# Patient Record
Sex: Female | Born: 1995 | Race: Black or African American | Hispanic: No | Marital: Single | Smoking: Never smoker
Health system: Southern US, Community
[De-identification: ages and names within clinical notes are randomized; demographics above are authoritative.]

## PROBLEM LIST (undated history)

## (undated) DIAGNOSIS — J45909 Unspecified asthma, uncomplicated: Secondary | ICD-10-CM

## (undated) HISTORY — DX: Unspecified asthma, uncomplicated: J45.909

---

## 2019-08-22 DIAGNOSIS — Z3201 Encounter for pregnancy test, result positive: Secondary | ICD-10-CM | POA: Diagnosis not present

## 2019-08-22 DIAGNOSIS — O200 Threatened abortion: Secondary | ICD-10-CM | POA: Diagnosis not present

## 2019-08-23 DIAGNOSIS — Z3201 Encounter for pregnancy test, result positive: Secondary | ICD-10-CM | POA: Diagnosis not present

## 2019-08-23 DIAGNOSIS — O200 Threatened abortion: Secondary | ICD-10-CM | POA: Diagnosis not present

## 2019-12-05 ENCOUNTER — Ambulatory Visit (INDEPENDENT_AMBULATORY_CARE_PROVIDER_SITE_OTHER): Payer: Medicaid Other | Admitting: Obstetrics & Gynecology

## 2019-12-05 ENCOUNTER — Encounter: Payer: Self-pay | Admitting: Obstetrics & Gynecology

## 2019-12-05 ENCOUNTER — Other Ambulatory Visit (HOSPITAL_COMMUNITY)
Admission: RE | Admit: 2019-12-05 | Discharge: 2019-12-05 | Disposition: A | Discharge status: Home / Self Care | Payer: Medicaid Other | Source: Ambulatory Visit | Attending: Obstetrics & Gynecology | Admitting: Obstetrics & Gynecology

## 2019-12-05 ENCOUNTER — Encounter: Payer: Self-pay | Admitting: Obstetrics

## 2019-12-05 ENCOUNTER — Other Ambulatory Visit: Payer: Self-pay

## 2019-12-05 VITALS — BP 101/64 | HR 96 | Ht 68.0 in | Wt 194.5 lb

## 2019-12-05 DIAGNOSIS — Z3481 Encounter for supervision of other normal pregnancy, first trimester: Secondary | ICD-10-CM

## 2019-12-05 DIAGNOSIS — Z3402 Encounter for supervision of normal first pregnancy, second trimester: Secondary | ICD-10-CM

## 2019-12-05 DIAGNOSIS — Z315 Encounter for genetic counseling: Secondary | ICD-10-CM | POA: Diagnosis not present

## 2019-12-05 DIAGNOSIS — J45909 Unspecified asthma, uncomplicated: Secondary | ICD-10-CM

## 2019-12-05 DIAGNOSIS — Z3482 Encounter for supervision of other normal pregnancy, second trimester: Secondary | ICD-10-CM | POA: Diagnosis not present

## 2019-12-05 DIAGNOSIS — O99519 Diseases of the respiratory system complicating pregnancy, unspecified trimester: Secondary | ICD-10-CM

## 2019-12-05 NOTE — Progress Notes (Signed)
  Subjective:    Rita Logan is a G1P0 [redacted]w[redacted]d being seen today for her first obstetrical visit.  Her obstetrical history is significant for asthma. Patient does intend to breast feed. Pregnancy history fully reviewed.  Patient reports no complaints.  Vitals:   12/05/19 1416 12/05/19 1417  BP: 101/64   Pulse: 96   Weight: 194 lb 8 oz (88.2 kg)   Height:  5\' 8"  (1.727 m)    HISTORY: OB History  Gravida Para Term Preterm AB Living  1            SAB TAB Ectopic Multiple Live Births               # Outcome Date GA Lbr Len/2nd Weight Sex Delivery Anes PTL Lv  1 Current            Past Medical History:  Diagnosis Date  . Asthma    History reviewed. No pertinent surgical history. History reviewed. No pertinent family history.   Exam    Uterus:     Pelvic Exam:    Perineum: No Hemorrhoids   Vulva: normal   Vagina:  thin grey discharge   pH:     Cervix: no lesions   Adnexa: not evaluated   Bony Pelvis: average  System: Breast:  normal appearance, no masses or tenderness   Skin: normal coloration and turgor, no rashes    Neurologic: oriented, normal mood   Extremities: normal strength, tone, and muscle mass   HEENT PERRLA, extra ocular movement intact, thyroid without masses and trachea midline   Mouth/Teeth mucous membranes moist, pharynx normal without lesions and dental hygiene good   Neck supple and no masses   Cardiovascular: regular rate and rhythm   Respiratory:  appears well, vitals normal, no respiratory distress, acyanotic, normal RR, neck free of mass or lymphadenopathy, chest clear, no wheezing, crepitations, rhonchi, normal symmetric air entry   Abdomen: 21 week size fundus   Urinary: urethral meatus normal      Assessment:    Pregnancy: G1P0 Patient Active Problem List   Diagnosis Date Noted  . Encounter for supervision of normal first pregnancy in second trimester 12/05/2019        Plan:     Initial labs drawn. Prenatal  vitamins. Problem list reviewed and updated. Panorama and Horizon ordered.  Ultrasound discussed; fetal survey: ordered.  Follow up in 4 weeks. 50% of 30 min visit spent on counseling and coordination of care.  EDD based on conception date   12/07/2019 12/05/2019

## 2019-12-05 NOTE — Progress Notes (Signed)
Pt is here for new OB appt. Unsure of LMP, working off of LMP as 07/08/2019. Last pap was last in December 2020 per patient, normal.

## 2019-12-05 NOTE — Patient Instructions (Signed)

## 2019-12-06 LAB — CERVICOVAGINAL ANCILLARY ONLY
Bacterial Vaginitis (gardnerella): NEGATIVE
Candida Glabrata: NEGATIVE
Candida Vaginitis: NEGATIVE
Chlamydia: NEGATIVE
Comment: NEGATIVE
Comment: NEGATIVE
Comment: NEGATIVE
Comment: NEGATIVE
Comment: NEGATIVE
Comment: NORMAL
Neisseria Gonorrhea: NEGATIVE
Trichomonas: NEGATIVE

## 2019-12-06 LAB — OBSTETRIC PANEL, INCLUDING HIV
Antibody Screen: NEGATIVE
Basophils Absolute: 0 10*3/uL (ref 0.0–0.2)
Basos: 0 %
EOS (ABSOLUTE): 0.1 10*3/uL (ref 0.0–0.4)
Eos: 1 %
HIV Screen 4th Generation wRfx: NONREACTIVE
Hematocrit: 34.3 % (ref 34.0–46.6)
Hemoglobin: 11.3 g/dL (ref 11.1–15.9)
Hepatitis B Surface Ag: NEGATIVE
Immature Grans (Abs): 0 10*3/uL (ref 0.0–0.1)
Immature Granulocytes: 1 %
Lymphocytes Absolute: 2.6 10*3/uL (ref 0.7–3.1)
Lymphs: 30 %
MCH: 28 pg (ref 26.6–33.0)
MCHC: 32.9 g/dL (ref 31.5–35.7)
MCV: 85 fL (ref 79–97)
Monocytes Absolute: 0.6 10*3/uL (ref 0.1–0.9)
Monocytes: 7 %
Neutrophils Absolute: 5.2 10*3/uL (ref 1.4–7.0)
Neutrophils: 61 %
Platelets: 202 10*3/uL (ref 150–450)
RBC: 4.03 x10E6/uL (ref 3.77–5.28)
RDW: 13.9 % (ref 11.7–15.4)
RPR Ser Ql: NONREACTIVE
Rh Factor: POSITIVE
Rubella Antibodies, IGG: 2.98 index (ref >0.99)
WBC: 8.6 10*3/uL (ref 3.4–10.8)

## 2019-12-06 LAB — HEPATITIS C ANTIBODY: Hep C Virus Ab: <0.1 s/co ratio (ref 0.0–0.9)

## 2019-12-08 LAB — URINE CULTURE, OB REFLEX

## 2019-12-08 LAB — CULTURE, OB URINE

## 2019-12-12 ENCOUNTER — Encounter: Payer: Self-pay | Admitting: Obstetrics and Gynecology

## 2019-12-19 ENCOUNTER — Encounter: Payer: Self-pay | Admitting: Obstetrics and Gynecology

## 2019-12-19 DIAGNOSIS — D563 Thalassemia minor: Secondary | ICD-10-CM | POA: Insufficient documentation

## 2019-12-23 DIAGNOSIS — Z20828 Contact with and (suspected) exposure to other viral communicable diseases: Secondary | ICD-10-CM | POA: Diagnosis not present

## 2019-12-28 ENCOUNTER — Ambulatory Visit: Payer: Medicaid Other | Attending: Obstetrics and Gynecology

## 2019-12-28 ENCOUNTER — Other Ambulatory Visit: Payer: Self-pay

## 2019-12-28 ENCOUNTER — Other Ambulatory Visit: Payer: Self-pay | Admitting: *Deleted

## 2019-12-28 DIAGNOSIS — Z3A24 24 weeks gestation of pregnancy: Secondary | ICD-10-CM

## 2019-12-28 DIAGNOSIS — Z363 Encounter for antenatal screening for malformations: Secondary | ICD-10-CM | POA: Diagnosis not present

## 2019-12-28 DIAGNOSIS — Z3687 Encounter for antenatal screening for uncertain dates: Secondary | ICD-10-CM

## 2019-12-28 DIAGNOSIS — Z3402 Encounter for supervision of normal first pregnancy, second trimester: Secondary | ICD-10-CM | POA: Insufficient documentation

## 2019-12-28 DIAGNOSIS — Z362 Encounter for other antenatal screening follow-up: Secondary | ICD-10-CM

## 2020-01-02 ENCOUNTER — Ambulatory Visit (INDEPENDENT_AMBULATORY_CARE_PROVIDER_SITE_OTHER): Payer: Medicaid Other | Admitting: Advanced Practice Midwife

## 2020-01-02 ENCOUNTER — Encounter: Payer: Self-pay | Admitting: Obstetrics

## 2020-01-02 ENCOUNTER — Other Ambulatory Visit: Payer: Self-pay

## 2020-01-02 ENCOUNTER — Other Ambulatory Visit: Payer: Medicaid Other

## 2020-01-02 ENCOUNTER — Encounter: Payer: Self-pay | Admitting: Advanced Practice Midwife

## 2020-01-02 DIAGNOSIS — Z3400 Encounter for supervision of normal first pregnancy, unspecified trimester: Secondary | ICD-10-CM | POA: Diagnosis not present

## 2020-01-02 DIAGNOSIS — Z3402 Encounter for supervision of normal first pregnancy, second trimester: Secondary | ICD-10-CM | POA: Diagnosis not present

## 2020-01-02 DIAGNOSIS — J45909 Unspecified asthma, uncomplicated: Secondary | ICD-10-CM

## 2020-01-02 DIAGNOSIS — Z3A26 26 weeks gestation of pregnancy: Secondary | ICD-10-CM

## 2020-01-02 DIAGNOSIS — O99512 Diseases of the respiratory system complicating pregnancy, second trimester: Secondary | ICD-10-CM

## 2020-01-02 MED ORDER — PRENATAL VITAMIN 27-0.8 MG PO TABS: 1.0000 | ORAL_TABLET | Freq: Every day | ORAL | 12 refills | Status: AC

## 2020-01-02 MED ORDER — BLOOD PRESSURE KIT DEVI: 1.0000 | 0 refills | Status: AC | PRN

## 2020-01-02 MED ORDER — HYDROCORTISONE 1 % EX OINT: 1.0000 "application " | TOPICAL_OINTMENT | Freq: Two times a day (BID) | CUTANEOUS | 0 refills | Status: AC

## 2020-01-02 MED ORDER — BUDESONIDE 90 MCG/ACT IN AEPB: 1.0000 | INHALATION_SPRAY | Freq: Two times a day (BID) | RESPIRATORY_TRACT | 2 refills | Status: AC

## 2020-01-02 NOTE — Addendum Note (Signed)
Addended by: Natale Milch D on: 01/02/2020 11:25 AM   Modules accepted: Orders

## 2020-01-02 NOTE — Progress Notes (Signed)
   PRENATAL VISIT NOTE  Subjective:  Rita Logan is a 24 y.o. G1P0 at [redacted]w[redacted]d being seen today for ongoing prenatal care.  She is currently monitored for the following issues for this low-risk pregnancy and has Encounter for supervision of normal first pregnancy in second trimester and Alpha thalassemia silent carrier on their problem list.  Patient reports no complaints.  Contractions: Not present. Vag. Bleeding: None.  Movement: Present. Denies leaking of fluid.   The following portions of the patient's history were reviewed and updated as appropriate: allergies, current medications, past family history, past medical history, past social history, past surgical history and problem list.   Objective:   Vitals:   01/02/20 1033  BP: 117/71  Pulse: 96  Weight: 198 lb 4.8 oz (89.9 kg)    Fetal Status: Fetal Heart Rate (bpm): 153 Fundal Height: 26 cm Movement: Present     General:  Alert, oriented and cooperative. Patient is in no acute distress.  Skin: Skin is warm and dry. No rash noted.   Cardiovascular: Normal heart rate noted  Respiratory: Normal respiratory effort, no problems with respiration noted  Abdomen: Soft, gravid, appropriate for gestational age.  Pain/Pressure: Absent     Pelvic: Cervical exam deferred        Extremities: Normal range of motion.     Mental Status: Normal mood and affect. Normal behavior. Normal judgment and thought content.   Assessment and Plan:  Pregnancy: G1P0 at [redacted]w[redacted]d 1. Encounter for supervision of normal first pregnancy in second trimester - routine care - GTT and 28 week labs today  - Patient has asthma and only uses rescue inhaler. She is now feeling like she is having worsening asthma symptoms. Her pulmonologist is in [place]. Using rescue inhaler 3-4 times per day. Add pulmicort and will send referral to pulmonology  - Also has worsening eczema on neck, chest. RX sent for hydrocortisone ointment - needs note to be able to schedule  dental visit, note provided    Preterm labor symptoms and general obstetric precautions including but not limited to vaginal bleeding, contractions, leaking of fluid and fetal movement were reviewed in detail with the patient. Please refer to After Visit Summary for other counseling recommendations.   Return in about 2 weeks (around 01/16/2020) for virtual visit .  Future Appointments  Date Time Provider Department Center  01/16/2020  8:55 AM [facility], Wilmer Floor, CNM [facility] None  01/28/2020  8:00 AM [facility] NURSE [facility]  01/28/2020  8:00 AM [facility] US1 [facility]    Thressa Sheller DNP, CNM  01/02/20  11:15 AM

## 2020-01-02 NOTE — Progress Notes (Signed)
Patient reports fetal movement, denies pain. 

## 2020-01-03 LAB — RPR: RPR Ser Ql: NONREACTIVE

## 2020-01-03 LAB — GLUCOSE TOLERANCE, 2 HOURS W/ 1HR
Glucose, 1 hour: 91 mg/dL (ref 65–179)
Glucose, 2 hour: 77 mg/dL (ref 65–152)
Glucose, Fasting: 76 mg/dL (ref 65–91)

## 2020-01-03 LAB — HIV ANTIBODY (ROUTINE TESTING W REFLEX): HIV Screen 4th Generation wRfx: NONREACTIVE

## 2020-01-16 ENCOUNTER — Telehealth (INDEPENDENT_AMBULATORY_CARE_PROVIDER_SITE_OTHER): Payer: Medicaid Other | Admitting: Advanced Practice Midwife

## 2020-01-16 DIAGNOSIS — Z3A28 28 weeks gestation of pregnancy: Secondary | ICD-10-CM

## 2020-01-16 DIAGNOSIS — Z3402 Encounter for supervision of normal first pregnancy, second trimester: Secondary | ICD-10-CM

## 2020-01-16 DIAGNOSIS — D563 Thalassemia minor: Secondary | ICD-10-CM

## 2020-01-16 DIAGNOSIS — O99013 Anemia complicating pregnancy, third trimester: Secondary | ICD-10-CM

## 2020-01-16 DIAGNOSIS — O288 Other abnormal findings on antenatal screening of mother: Secondary | ICD-10-CM

## 2020-01-16 NOTE — Progress Notes (Signed)
    PRENATAL VISIT NOTE  Subjective:  Rita Logan is a 24 y.o. G1P0 at [redacted]w[redacted]d being seen today for ongoing prenatal care.  She is currently monitored for the following issues for this low-risk pregnancy and has Encounter for supervision of normal first pregnancy in second trimester and Alpha thalassemia silent carrier on their problem list.  Patient reports no complaints.  Contractions: Not present. Vag. Bleeding: None.  Movement: Present. Denies leaking of fluid.   The following portions of the patient's history were reviewed and updated as appropriate: allergies, current medications, past family history, past medical history, past social history, past surgical history and problem list.   Objective:  There were no vitals filed for this visit.  Fetal Status:     Movement: Present     General:  Alert, oriented and cooperative. Patient is in no acute distress.  Skin: Skin is warm and dry. No rash noted.   Cardiovascular: Normal heart rate noted  Respiratory: Normal respiratory effort, no problems with respiration noted  Abdomen: Soft, gravid, appropriate for gestational age.  Pain/Pressure: Absent     Pelvic: Cervical exam deferred        Extremities: Normal range of motion.  Edema: None  Mental Status: Normal mood and affect. Normal behavior. Normal judgment and thought content.   Assessment and Plan:  Pregnancy: G1P0 at [redacted]w[redacted]d 1. Alpha thalassemia silent carrier --Discussed testing results, available counseling from [facility] and counseling/partner testing from [place]. Pt to contact Avelina Laine, given phone and website today.  2. Encounter for supervision of normal first pregnancy in second trimester --Pt reports good fetal movement, denies cramping, LOF, or vaginal bleeding --Anticipatory guidance about next visits/weeks of pregnancy given. --Next visit in 4 weeks, virtual  Preterm labor symptoms and general obstetric precautions including but not limited to vaginal bleeding,  contractions, leaking of fluid and fetal movement were reviewed in detail with the patient. Please refer to After Visit Summary for other counseling recommendations.   Return in about 4 weeks (around 02/13/2020).  Future Appointments  Date Time Provider Department Center  01/28/2020  8:00 AM [facility] NURSE [facility]  01/28/2020  8:00 AM [facility] US1 [facility]    Sharen Counter, CNM

## 2020-01-16 NOTE — Progress Notes (Signed)
Virtual ROB   CC:  NONE   Pt not made aware of 2hr GTT Results yet. Not on Mychart.

## 2020-01-28 ENCOUNTER — Ambulatory Visit: Payer: Medicaid Other | Admitting: *Deleted

## 2020-01-28 ENCOUNTER — Other Ambulatory Visit: Payer: Self-pay | Admitting: *Deleted

## 2020-01-28 ENCOUNTER — Other Ambulatory Visit: Payer: Self-pay

## 2020-01-28 ENCOUNTER — Ambulatory Visit: Payer: Medicaid Other | Attending: Obstetrics and Gynecology

## 2020-01-28 DIAGNOSIS — Z3402 Encounter for supervision of normal first pregnancy, second trimester: Secondary | ICD-10-CM

## 2020-01-28 DIAGNOSIS — D563 Thalassemia minor: Secondary | ICD-10-CM | POA: Insufficient documentation

## 2020-01-28 DIAGNOSIS — Z362 Encounter for other antenatal screening follow-up: Secondary | ICD-10-CM | POA: Insufficient documentation

## 2020-01-28 DIAGNOSIS — Z3A28 28 weeks gestation of pregnancy: Secondary | ICD-10-CM | POA: Diagnosis not present

## 2020-01-29 ENCOUNTER — Inpatient Hospital Stay (HOSPITAL_COMMUNITY)
Admission: AD | Admit: 2020-01-29 | Discharge: 2020-01-29 | Disposition: A | Discharge status: Home / Self Care | Payer: Medicaid Other | Attending: Obstetrics and Gynecology | Admitting: Obstetrics and Gynecology

## 2020-01-29 ENCOUNTER — Encounter (HOSPITAL_COMMUNITY): Payer: Self-pay | Admitting: Obstetrics and Gynecology

## 2020-01-29 ENCOUNTER — Other Ambulatory Visit: Payer: Self-pay

## 2020-01-29 DIAGNOSIS — D563 Thalassemia minor: Secondary | ICD-10-CM

## 2020-01-29 DIAGNOSIS — Z3402 Encounter for supervision of normal first pregnancy, second trimester: Secondary | ICD-10-CM

## 2020-01-29 DIAGNOSIS — O26899 Other specified pregnancy related conditions, unspecified trimester: Secondary | ICD-10-CM

## 2020-01-29 DIAGNOSIS — O26893 Other specified pregnancy related conditions, third trimester: Secondary | ICD-10-CM | POA: Diagnosis not present

## 2020-01-29 DIAGNOSIS — Z3A30 30 weeks gestation of pregnancy: Secondary | ICD-10-CM | POA: Diagnosis not present

## 2020-01-29 DIAGNOSIS — J45909 Unspecified asthma, uncomplicated: Secondary | ICD-10-CM | POA: Diagnosis not present

## 2020-01-29 DIAGNOSIS — O99513 Diseases of the respiratory system complicating pregnancy, third trimester: Secondary | ICD-10-CM | POA: Insufficient documentation

## 2020-01-29 DIAGNOSIS — R109 Unspecified abdominal pain: Secondary | ICD-10-CM | POA: Insufficient documentation

## 2020-01-29 LAB — WET PREP, GENITAL
Clue Cells Wet Prep HPF POC: NONE SEEN
Sperm: NONE SEEN
Trich, Wet Prep: NONE SEEN
Yeast Wet Prep HPF POC: NONE SEEN

## 2020-01-29 LAB — URINALYSIS, ROUTINE W REFLEX MICROSCOPIC
Bilirubin Urine: NEGATIVE
Glucose, UA: NEGATIVE mg/dL
Hgb urine dipstick: NEGATIVE
Ketones, ur: NEGATIVE mg/dL
Leukocytes,Ua: NEGATIVE
Nitrite: NEGATIVE
Protein, ur: NEGATIVE mg/dL
Specific Gravity, Urine: 1.023 (ref 1.005–1.030)
pH: 5 (ref 5.0–8.0)

## 2020-01-29 LAB — HIV ANTIBODY (ROUTINE TESTING W REFLEX): HIV Screen 4th Generation wRfx: NONREACTIVE

## 2020-01-29 NOTE — MAU Note (Signed)
Was having cramping, started last night, continued today. Was worse when she was at work.  No bleeding or leaking. Denies hx of PTL. No recent intercourse. Denies any diarrhea or constipation. No urinary symptoms.

## 2020-01-29 NOTE — MAU Provider Note (Addendum)
History     CSN: [id number]  Arrival date and time: 01/29/20 1538   First Provider Initiated Contact with Patient 01/29/20 1936      Chief Complaint  Patient presents with  . Abdominal Pain   [name] is a 24 y.o. year old G47P0 female at 29w1dweeks gestation who presents to MAU reporting abdominal cramping that started last night has continued through today was worse while she was at work.  She denies vaginal bleeding or leaking of fluid. She has no history of preterm labor. She denies recent sexual intercourse. She denies diarrhea, constipation or urinary symptoms. [facility] She has a virtual visit there on 02/13/2020.   OB History    Gravida  1   Para      Term      Preterm      AB      Living        SAB      TAB      Ectopic      Multiple      Live Births              Past Medical History:  Diagnosis Date  . Asthma     History reviewed. No pertinent surgical history.  History reviewed. No pertinent family history.  Social History   Tobacco Use  . Smoking status: Never Smoker  . Smokeless tobacco: Never Used  Substance Use Topics  . Alcohol use: Not Currently  . Drug use: Never    Allergies:  Allergies  Allergen Reactions  . Nickel   . Tomato     Medications Prior to Admission  Medication Sig Dispense Refill Last Dose  . Budesonide 90 MCG/ACT inhaler Inhale 1 puff into the lungs 2 (two) times daily. 1 each 2 01/29/2020 at Unknown time  . Prenatal Vit-Fe Fumarate-FA (PRENATAL VITAMIN) 27-0.8 MG TABS Take 1 tablet by mouth daily. 30 tablet 12 01/29/2020 at Unknown time  . Blood Pressure Monitoring (BLOOD PRESSURE KIT) DEVI 1 kit by Does not apply route as needed. (Patient not taking: Reported on 01/16/2020) 1 each 0   . hydrocortisone 1 % ointment Apply 1 application topically 2 (two) times daily. 30 g 0   . prenatal vitamin w/FE, FA (PRENATAL 1 + 1) 27-1 MG TABS tablet Take 1 tablet by mouth daily at 12 noon.        Review of Systems  Constitutional: Negative.   HENT: Negative.   Eyes: Negative.   Respiratory: Negative.   Cardiovascular: Negative.   Gastrointestinal: Negative.   Endocrine: Negative.   Genitourinary: Positive for pelvic pain (cramping since last night).  Musculoskeletal: Negative.   Skin: Negative.   Allergic/Immunologic: Negative.   Neurological: Negative.   Hematological: Negative.   Psychiatric/Behavioral: Negative.    Physical Exam   Blood pressure 115/68, pulse 91, temperature 98.7 F (37.1 C), temperature source Oral, resp. rate 18, height 5' 8"  (1.727 m), weight 93 kg, last menstrual period 07/08/2019, SpO2 100 %.  Physical Exam  Physical Examination: General appearance - alert, well appearing, and in no distress and oriented to person, place, and time Mental status - alert, oriented to person, place, and time Eyes - pupils equal and reactive, extraocular eye movements intact Ears - bilateral TM's and external ear canals normal Nose - normal and patent, no erythema, discharge or polyps Mouth - mucous membranes moist, pharynx normal without lesions Neck - supple, no significant adenopathy Lymphatics - no palpable lymphadenopathy,  no hepatosplenomegaly Chest - clear to auscultation, no wheezes, rales or rhonchi, symmetric air entry Heart - normal rate, regular rhythm, normal S1, S2, no murmurs, rubs, clicks or gallops Abdomen - soft, nontender, nondistended, no masses or organomegaly Breasts - not examined Pelvic - normal external genitalia, vulva, vagina, cervix, uterus and adnexa, WET MOUNT done - Back exam - full range of motion, no tenderness, palpable spasm or pain on motion Neurological - alert, oriented, normal speech, no focal findings or movement disorder noted Musculoskeletal - no joint tenderness, deformity or swelling Extremities - peripheral pulses normal, no pedal edema, no clubbing or cyanosis Skin - normal coloration and turgor, no rashes, no  suspicious skin lesions noted  REACTIVE NST - FHR: 135 bpm / moderate variability / accels present / decels absent / TOCO: Occ UI noted  MAU Course  Procedures  MDM Wet Prep GC/CT --Results pending  Tylenol 1000 mg po -- declined  Results for orders placed or performed during the hospital encounter of 01/29/20 (from the past 24 hour(s))  Urinalysis, Routine w reflex microscopic     Status: Abnormal   Collection Time: 01/29/20  5:40 PM  Result Value Ref Range   Color, Urine YELLOW YELLOW   APPearance HAZY (A) CLEAR   Specific Gravity, Urine 1.023 1.005 - 1.030   pH 5.0 5.0 - 8.0   Glucose, UA NEGATIVE NEGATIVE mg/dL   Hgb urine dipstick NEGATIVE NEGATIVE   Bilirubin Urine NEGATIVE NEGATIVE   Ketones, ur NEGATIVE NEGATIVE mg/dL   Protein, ur NEGATIVE NEGATIVE mg/dL   Nitrite NEGATIVE NEGATIVE   Leukocytes,Ua NEGATIVE NEGATIVE  Wet prep, genital     Status: Abnormal   Collection Time: 01/29/20  7:44 PM   Specimen: PATH Cytology Cervicovaginal Ancillary Only  Result Value Ref Range   Yeast Wet Prep HPF POC NONE SEEN NONE SEEN   Trich, Wet Prep NONE SEEN NONE SEEN   Clue Cells Wet Prep HPF POC NONE SEEN NONE SEEN   WBC, Wet Prep HPF POC MANY (A) NONE SEEN   Sperm NONE SEEN     Assessment and Plan  Cramping affecting pregnancy, antepartum  - Advised to increase daily water intake  - Information provided on abd pain in pregnancy - Discharge patient - Keep scheduled appt at [facility] on 02/13/20 - Patient verbalized an understanding of the plan of care and agrees.    Laury Deep, MSN, CNM 01/29/2020, 7:36 PM

## 2020-01-29 NOTE — Discharge Instructions (Signed)
Abdominal Pain During Pregnancy  Abdominal pain is common during pregnancy, and has many possible causes. Some causes are more serious than others, and sometimes the cause is not known. Abdominal pain can be a sign that labor is starting. It can also be caused by normal growth and stretching of muscles and ligaments during pregnancy. Always tell your health care provider if you have any abdominal pain. Follow these instructions at home:  Do not have sex or put anything in your vagina until your pain goes away completely.  Get plenty of rest until your pain improves.  Drink enough fluid to keep your urine pale yellow.  Take over-the-counter and prescription medicines only as told by your health care provider.  Keep all follow-up visits as told by your health care provider. This is important. Contact a health care provider if:  Your pain continues or gets worse after resting.  You have lower abdominal pain that: ? Comes and goes at regular intervals. ? Spreads to your back. ? Is similar to menstrual cramps.  You have pain or burning when you urinate. Get help right away if:  You have a fever or chills.  You have vaginal bleeding.  You are leaking fluid from your vagina.  You are passing tissue from your vagina.  You have vomiting or diarrhea that lasts for more than 24 hours.  Your baby is moving less than usual.  You feel very weak or faint.  You have shortness of breath.  You develop severe pain in your upper abdomen. Summary  Abdominal pain is common during pregnancy, and has many possible causes.  If you experience abdominal pain during pregnancy, tell your health care provider right away.  Follow your health care provider's home care instructions and keep all follow-up visits as directed. This information is not intended to replace advice given to you by your health care provider. Make sure you discuss any questions you have with your health care  provider. Document Revised: 11/27/2018 Document Reviewed: 11/11/2016 Elsevier Patient Education  2020 Elsevier Inc.  

## 2020-01-30 LAB — GC/CHLAMYDIA PROBE AMP (~~LOC~~) NOT AT ARMC
Chlamydia: NEGATIVE
Comment: NEGATIVE
Comment: NORMAL
Neisseria Gonorrhea: NEGATIVE

## 2020-02-13 ENCOUNTER — Encounter: Payer: Self-pay | Admitting: Women's Health

## 2020-02-13 ENCOUNTER — Telehealth (INDEPENDENT_AMBULATORY_CARE_PROVIDER_SITE_OTHER): Payer: Medicaid Other | Admitting: Women's Health

## 2020-02-13 DIAGNOSIS — Z3A31 31 weeks gestation of pregnancy: Secondary | ICD-10-CM

## 2020-02-13 DIAGNOSIS — Z3403 Encounter for supervision of normal first pregnancy, third trimester: Secondary | ICD-10-CM

## 2020-02-13 DIAGNOSIS — D563 Thalassemia minor: Secondary | ICD-10-CM

## 2020-02-13 DIAGNOSIS — Z3402 Encounter for supervision of normal first pregnancy, second trimester: Secondary | ICD-10-CM

## 2020-02-13 NOTE — Progress Notes (Signed)
I connected with Darrow Bussing on 02/13/20 at  8:55 AM EDT by: MyChart video and verified that I am speaking with the correct person using two identifiers.  Patient is located at home and provider is located at [facility].     The purpose of this virtual visit is to provide medical care while limiting exposure to the novel coronavirus. I discussed the limitations, risks, security and privacy concerns of performing an evaluation and management service by MyChart video and the availability of in person appointments. I also discussed with the patient that there may be a patient responsible charge related to this service. By engaging in this virtual visit, you consent to the provision of healthcare.  Additionally, you authorize for your insurance to be billed for the services provided during this visit.  The patient expressed understanding and agreed to proceed.  The following staff members participated in the virtual visit:  Donia Ast    PRENATAL VISIT NOTE  Subjective:  Tanicka Laguna is a 24 y.o. G1P0 at [redacted]w[redacted]d  for phone visit for ongoing prenatal care.  She is currently monitored for the following issues for this low-risk pregnancy and has Encounter for supervision of normal first pregnancy in second trimester and Alpha thalassemia silent carrier on their problem list.  Patient reports no complaints.  Contractions: Irritability. Vag. Bleeding: None.  Movement: Present. Denies leaking of fluid.   The following portions of the patient's history were reviewed and updated as appropriate: allergies, current medications, past family history, past medical history, past social history, past surgical history and problem list.   Objective:  There were no vitals filed for this visit.  Fetal Status:     Movement: Present     Assessment and Plan:  Pregnancy: G1P0 at [redacted]w[redacted]d 1. Alpha thalassemia silent carrier -genetic counseling not performed, pt did not contact [employer], pt does not wish to have  genetic counseling at this time, informed she can contact at any time if she changes her mind  2. Encounter for supervision of normal first pregnancy in second trimester -[place] scheduled 03/10/2020, pt aware -pt to pick up BP cuff from [employer] today or tomorrow and to take BP with nurse on-site to assure fit of cuff -Tdap info given, pt will consider -discussed contraception, info given  Preterm labor symptoms and general obstetric precautions including but not limited to vaginal bleeding, contractions, leaking of fluid and fetal movement were reviewed in detail with the patient.  Return in about 2 weeks (around 02/27/2020) for in-person LOB/APP OK, needs to pick up BP cuff and have nurse visit to check for fit in 1-2days.  Future Appointments  Date Time Provider Department Center  03/10/2020 10:00 AM [facility] US1 [facility]     Time spent on virtual visit: 10 minutes  Marylen Ponto, NP

## 2020-02-13 NOTE — Patient Instructions (Addendum)
Maternity Assessment Unit (MAU)  The Maternity Assessment Unit (MAU) is located at the [facility] and [place] at [facility]. The address is: [address] [place], [place], [place] [address]. Please see map below for additional directions.    The Maternity Assessment Unit is designed to help you during your pregnancy, and for up to 6 weeks after delivery, with any pregnancy- or postpartum-related emergencies, if you think you are in labor, or if your water has broken. For example, if you experience nausea and vomiting, vaginal bleeding, severe abdominal or pelvic pain, elevated blood pressure or other problems related to your pregnancy or postpartum time, please come to the Maternity Assessment Unit for assistance.      Preterm Labor and Birth Information  The normal length of a pregnancy is 39-41 weeks. Preterm labor is when labor starts before 37 completed weeks of pregnancy. What are the risk factors for preterm labor? Preterm labor is more likely to occur in women who:  Have certain infections during pregnancy such as a bladder infection, sexually transmitted infection, or infection inside the uterus (chorioamnionitis).  Have a shorter-than-normal cervix.  Have gone into preterm labor before.  Have had surgery on their cervix.  Are younger than age 25 or older than age 60.  Are African American.  Are pregnant with twins or multiple babies (multiple gestation).  Take street drugs or smoke while pregnant.  Do not gain enough weight while pregnant.  Became pregnant shortly after having been pregnant. What are the symptoms of preterm labor? Symptoms of preterm labor include:  Cramps similar to those that can happen during a menstrual period. The cramps may happen with diarrhea.  Pain in the abdomen or lower back.  Regular uterine contractions that may feel like tightening of the abdomen.  A feeling of increased pressure in the  pelvis.  Increased watery or bloody mucus discharge from the vagina.  Water breaking (ruptured amniotic sac). Why is it important to recognize signs of preterm labor? It is important to recognize signs of preterm labor because babies who are born prematurely may not be fully developed. This can put them at an increased risk for:  Long-term (chronic) heart and lung problems.  Difficulty immediately after birth with regulating body systems, including blood sugar, body temperature, heart rate, and breathing rate.  Bleeding in the brain.  Cerebral palsy.  Learning difficulties.  Death. These risks are highest for babies who are born before 33 weeks of pregnancy. How is preterm labor treated? Treatment depends on the length of your pregnancy, your condition, and the health of your baby. It may involve:  Having a stitch (suture) placed in your cervix to prevent your cervix from opening too early (cerclage).  Taking or being given medicines, such as: ? Hormone medicines. These may be given early in pregnancy to help support the pregnancy. ? Medicine to stop contractions. ? Medicines to help mature the baby's lungs. These may be prescribed if the risk of delivery is high. ? Medicines to prevent your baby from developing cerebral palsy. If the labor happens before 34 weeks of pregnancy, you may need to stay in the hospital. What should I do if I think I am in preterm labor? If you think that you are going into preterm labor, call your health care provider right away. How can I prevent preterm labor in future pregnancies? To increase your chance of having a full-term pregnancy:  Do not use any tobacco products, such as cigarettes, chewing  tobacco, and e-cigarettes. If you need help quitting, ask your health care provider.  Do not use street drugs or medicines that have not been prescribed to you during your pregnancy.  Talk with your health care provider before taking any herbal  supplements, even if you have been taking them regularly.  Make sure you gain a healthy amount of weight during your pregnancy.  Watch for infection. If you think that you might have an infection, get it checked right away.  Make sure to tell your health care provider if you have gone into preterm labor before. This information is not intended to replace advice given to you by your health care provider. Make sure you discuss any questions you have with your health care provider. Document Revised: 12/01/2018 Document Reviewed: 12/31/2015 Elsevier Patient Education  2020 [employer].       https://www.cdc.gov/vaccines/hcp/vis/vis-statements/tdap.pdf">  Tdap (Tetanus, Diphtheria, Pertussis) Vaccine: What You Need to Know 1. Why get vaccinated? Tdap vaccine can prevent tetanus, diphtheria, and pertussis. Diphtheria and pertussis spread from person to person. Tetanus enters the body through cuts or wounds.  TETANUS (T) causes painful stiffening of the muscles. Tetanus can lead to serious health problems, including being unable to open the mouth, having trouble swallowing and breathing, or death.  DIPHTHERIA (D) can lead to difficulty breathing, heart failure, paralysis, or death.  PERTUSSIS (aP), also known as "whooping cough," can cause uncontrollable, violent coughing which makes it hard to breathe, eat, or drink. Pertussis can be extremely serious in babies and young children, causing pneumonia, convulsions, brain damage, or death. In teens and adults, it can cause weight loss, loss of bladder control, passing out, and rib fractures from severe coughing. 2. Tdap vaccine Tdap is only for children 7 years and older, adolescents, and adults.  Adolescents should receive a single dose of Tdap, preferably at age 42 or 12 years. Pregnant women should get a dose of Tdap during every pregnancy, to protect the newborn from pertussis. Infants are most at risk for severe, life-threatening  complications from pertussis. Adults who have never received Tdap should get a dose of Tdap. Also, adults should receive a booster dose every 10 years, or earlier in the case of a severe and dirty wound or burn. Booster doses can be either Tdap or Td (a different vaccine that protects against tetanus and diphtheria but not pertussis). Tdap may be given at the same time as other vaccines. 3. Talk with your health care provider Tell your vaccine provider if the person getting the vaccine:  Has had an allergic reaction after a previous dose of any vaccine that protects against tetanus, diphtheria, or pertussis, or has any severe, life-threatening allergies.  Has had a coma, decreased level of consciousness, or prolonged seizures within 7 days after a previous dose of any pertussis vaccine (DTP, DTaP, or Tdap).  Has seizures or another nervous system problem.  Has ever had Guillain-Barr Syndrome (also called GBS).  Has had severe pain or swelling after a previous dose of any vaccine that protects against tetanus or diphtheria. In some cases, your health care provider may decide to postpone Tdap vaccination to a future visit.  People with minor illnesses, such as a cold, may be vaccinated. People who are moderately or severely ill should usually wait until they recover before getting Tdap vaccine.  Your health care provider can give you more information. 4. Risks of a vaccine reaction  Pain, redness, or swelling where the shot was given, mild fever, headache, feeling  tired, and nausea, vomiting, diarrhea, or stomachache sometimes happen after Tdap vaccine. People sometimes faint after medical procedures, including vaccination. Tell your provider if you feel dizzy or have vision changes or ringing in the ears.  As with any medicine, there is a very remote chance of a vaccine causing a severe allergic reaction, other serious injury, or death. 5. What if there is a serious problem? An allergic  reaction could occur after the vaccinated person leaves the clinic. If you see signs of a severe allergic reaction (hives, swelling of the face and throat, difficulty breathing, a fast heartbeat, dizziness, or weakness), call 9-1-1 and get the person to the nearest hospital. For other signs that concern you, call your health care provider.  Adverse reactions should be reported to the Vaccine Adverse Event Reporting System (VAERS). Your health care provider will usually file this report, or you can do it yourself. Visit the VAERS website at www.vaers.LAgents.no or call (404) 356-3764. VAERS is only for reporting reactions, and VAERS staff do not give medical advice. 6. The National Vaccine Injury Compensation Program The [employer] Vaccine Injury Compensation Program (VICP) is a federal program that was created to compensate people who may have been injured by certain vaccines. Visit the VICP website at SpiritualWord.at or call (587)232-0818 to learn about the program and about filing a claim. There is a time limit to file a claim for compensation. 7. How can I learn more?  Ask your health care provider.  Call your local or state health department.  Contact the Centers for Disease Control and Prevention (CDC): ? Call 563-657-9289 (1-800-CDC-INFO) or ? Visit CDC's website at PicCapture.uy Vaccine Information Statement Tdap (Tetanus, Diphtheria, Pertussis) Vaccine (11/22/2018) This information is not intended to replace advice given to you by your health care provider. Make sure you discuss any questions you have with your health care provider. Document Revised: 12/01/2018 Document Reviewed: 12/04/2018 Elsevier Patient Education  2020 [employer].       Contraception Choices - [email or web address] Contraception, also called birth control, refers to methods or devices that prevent pregnancy. Hormonal methods Contraceptive implant  A contraceptive implant is a thin, plastic  tube that contains a hormone. It is inserted into the upper part of the arm. It can remain in place for up to 3 years. Progestin-only injections Progestin-only injections are injections of progestin, a synthetic form of the hormone progesterone. They are given every 3 months by a health care provider. Birth control pills  Birth control pills are pills that contain hormones that prevent pregnancy. They must be taken once a day, preferably at the same time each day. Birth control patch  The birth control patch contains hormones that prevent pregnancy. It is placed on the skin and must be changed once a week for three weeks and removed on the fourth week. A prescription is needed to use this method of contraception. Vaginal ring  A vaginal ring contains hormones that prevent pregnancy. It is placed in the vagina for three weeks and removed on the fourth week. After that, the process is repeated with a new ring. A prescription is needed to use this method of contraception. Emergency contraceptive Emergency contraceptives prevent pregnancy after unprotected sex. They come in pill form and can be taken up to 5 days after sex. They work best the sooner they are taken after having sex. Most emergency contraceptives are available without a prescription. This method should not be used as your only form of birth control. Barrier methods Female condom  A female condom is a thin sheath that is worn over the penis during sex. Condoms keep sperm from going inside a woman's body. They can be used with a spermicide to increase their effectiveness. They should be disposed after a single use. Female condom  A female condom is a soft, loose-fitting sheath that is put into the vagina before sex. The condom keeps sperm from going inside a woman's body. They should be disposed after a single use. Diaphragm  A diaphragm is a soft, dome-shaped barrier. It is inserted into the vagina before sex, along with a spermicide.  The diaphragm blocks sperm from entering the uterus, and the spermicide kills sperm. A diaphragm should be left in the vagina for 6-8 hours after sex and removed within 24 hours. A diaphragm is prescribed and fitted by a health care provider. A diaphragm should be replaced every 1-2 years, after giving birth, after gaining more than 15 lb (6.8 kg), and after pelvic surgery. Cervical cap  A cervical cap is a round, soft latex or plastic cup that fits over the cervix. It is inserted into the vagina before sex, along with spermicide. It blocks sperm from entering the uterus. The cap should be left in place for 6-8 hours after sex and removed within 48 hours. A cervical cap must be prescribed and fitted by a health care provider. It should be replaced every 2 years. Sponge  A sponge is a soft, circular piece of polyurethane foam with spermicide on it. The sponge helps block sperm from entering the uterus, and the spermicide kills sperm. To use it, you make it wet and then insert it into the vagina. It should be inserted before sex, left in for at least 6 hours after sex, and removed and thrown away within 30 hours. Spermicides Spermicides are chemicals that kill or block sperm from entering the cervix and uterus. They can come as a cream, jelly, suppository, foam, or tablet. A spermicide should be inserted into the vagina with an applicator at least 10-15 minutes before sex to allow time for it to work. The process must be repeated every time you have sex. Spermicides do not require a prescription. Intrauterine contraception Intrauterine device (IUD) An IUD is a T-shaped device that is put in a woman's uterus. There are two types:  Hormone IUD.This type contains progestin, a synthetic form of the hormone progesterone. This type can stay in place for 3-5 years.  Copper IUD.This type is wrapped in copper wire. It can stay in place for 10 years.  Permanent methods of contraception Female tubal  ligation In this method, a woman's fallopian tubes are sealed, tied, or blocked during surgery to prevent eggs from traveling to the uterus. Hysteroscopic sterilization In this method, a small, flexible insert is placed into each fallopian tube. The inserts cause scar tissue to form in the fallopian tubes and block them, so sperm cannot reach an egg. The procedure takes about 3 months to be effective. Another form of birth control must be used during those 3 months. Female sterilization This is a procedure to tie off the tubes that carry sperm (vasectomy). After the procedure, the man can still ejaculate fluid (semen). Natural planning methods Natural family planning In this method, a couple does not have sex on days when the woman could become pregnant. Calendar method This means keeping track of the length of each menstrual cycle, identifying the days when pregnancy can happen, and not having sex on those days. Ovulation method In  this method, a couple avoids sex during ovulation. Symptothermal method This method involves not having sex during ovulation. The woman typically checks for ovulation by watching changes in her temperature and in the consistency of cervical mucus. Post-ovulation method In this method, a couple waits to have sex until after ovulation. Summary  Contraception, also called birth control, means methods or devices that prevent pregnancy.  Hormonal methods of contraception include implants, injections, pills, patches, vaginal rings, and emergency contraceptives.  Barrier methods of contraception can include female condoms, female condoms, diaphragms, cervical caps, sponges, and spermicides.  There are two types of IUDs (intrauterine devices). An IUD can be put in a woman's uterus to prevent pregnancy for 3-5 years.  Permanent sterilization can be done through a procedure for males, females, or both.  Natural family planning methods involve not having sex on days when  the woman could become pregnant. This information is not intended to replace advice given to you by your health care provider. Make sure you discuss any questions you have with your health care provider. Document Revised: 08/11/2017 Document Reviewed: 09/11/2016 Elsevier Patient Education  2020 [employer].

## 2020-02-13 NOTE — Progress Notes (Signed)
Virtual Visit via Telephone Note  I connected with Rita Logan on 02/13/20 at  8:55 AM EDT by telephone and verified that I am speaking with the correct person using two identifiers.  Pt does not have BP cuff.  Pt will pick up BP cuff from office.  No complaints today per pt

## 2020-02-18 ENCOUNTER — Ambulatory Visit: Payer: Medicaid Other

## 2020-02-19 ENCOUNTER — Ambulatory Visit: Payer: Medicaid Other

## 2020-02-27 ENCOUNTER — Encounter: Payer: Medicaid Other | Admitting: Obstetrics and Gynecology

## 2020-03-02 DIAGNOSIS — O26893 Other specified pregnancy related conditions, third trimester: Secondary | ICD-10-CM | POA: Diagnosis not present

## 2020-03-02 DIAGNOSIS — O99513 Diseases of the respiratory system complicating pregnancy, third trimester: Secondary | ICD-10-CM | POA: Diagnosis not present

## 2020-03-02 DIAGNOSIS — N898 Other specified noninflammatory disorders of vagina: Secondary | ICD-10-CM | POA: Diagnosis not present

## 2020-03-02 DIAGNOSIS — O2690 Pregnancy related conditions, unspecified, unspecified trimester: Secondary | ICD-10-CM | POA: Diagnosis not present

## 2020-03-02 DIAGNOSIS — Z3A34 34 weeks gestation of pregnancy: Secondary | ICD-10-CM | POA: Diagnosis not present

## 2020-03-02 DIAGNOSIS — J45909 Unspecified asthma, uncomplicated: Secondary | ICD-10-CM | POA: Diagnosis not present

## 2020-03-02 DIAGNOSIS — O99013 Anemia complicating pregnancy, third trimester: Secondary | ICD-10-CM | POA: Diagnosis not present

## 2020-03-02 DIAGNOSIS — D560 Alpha thalassemia: Secondary | ICD-10-CM | POA: Diagnosis not present

## 2020-03-10 ENCOUNTER — Ambulatory Visit: Payer: Medicaid Other | Attending: Obstetrics and Gynecology

## 2020-03-10 ENCOUNTER — Ambulatory Visit: Payer: Medicaid Other

## 2020-03-26 DIAGNOSIS — Z3403 Encounter for supervision of normal first pregnancy, third trimester: Secondary | ICD-10-CM | POA: Diagnosis not present

## 2020-03-26 DIAGNOSIS — Z113 Encounter for screening for infections with a predominantly sexual mode of transmission: Secondary | ICD-10-CM | POA: Diagnosis not present

## 2020-03-26 DIAGNOSIS — Z3402 Encounter for supervision of normal first pregnancy, second trimester: Secondary | ICD-10-CM | POA: Diagnosis not present

## 2020-03-26 DIAGNOSIS — Z3400 Encounter for supervision of normal first pregnancy, unspecified trimester: Secondary | ICD-10-CM | POA: Diagnosis not present

## 2020-03-26 DIAGNOSIS — Z118 Encounter for screening for other infectious and parasitic diseases: Secondary | ICD-10-CM | POA: Diagnosis not present

## 2020-03-27 DIAGNOSIS — Z5329 Procedure and treatment not carried out because of patient's decision for other reasons: Secondary | ICD-10-CM | POA: Diagnosis not present

## 2020-03-27 DIAGNOSIS — Z3A38 38 weeks gestation of pregnancy: Secondary | ICD-10-CM | POA: Diagnosis not present

## 2020-03-27 DIAGNOSIS — R1084 Generalized abdominal pain: Secondary | ICD-10-CM | POA: Diagnosis not present

## 2020-03-27 DIAGNOSIS — O26893 Other specified pregnancy related conditions, third trimester: Secondary | ICD-10-CM | POA: Diagnosis not present

## 2020-03-27 DIAGNOSIS — R102 Pelvic and perineal pain: Secondary | ICD-10-CM | POA: Diagnosis not present

## 2020-03-27 DIAGNOSIS — N898 Other specified noninflammatory disorders of vagina: Secondary | ICD-10-CM | POA: Diagnosis not present

## 2020-03-28 DIAGNOSIS — Z23 Encounter for immunization: Secondary | ICD-10-CM | POA: Diagnosis not present

## 2020-04-02 DIAGNOSIS — O0993 Supervision of high risk pregnancy, unspecified, third trimester: Secondary | ICD-10-CM | POA: Diagnosis not present

## 2020-04-02 DIAGNOSIS — Z3493 Encounter for supervision of normal pregnancy, unspecified, third trimester: Secondary | ICD-10-CM | POA: Diagnosis not present

## 2020-04-02 DIAGNOSIS — Z3400 Encounter for supervision of normal first pregnancy, unspecified trimester: Secondary | ICD-10-CM | POA: Diagnosis not present

## 2020-04-02 DIAGNOSIS — Z3A38 38 weeks gestation of pregnancy: Secondary | ICD-10-CM | POA: Diagnosis not present

## 2020-04-09 DIAGNOSIS — O0993 Supervision of high risk pregnancy, unspecified, third trimester: Secondary | ICD-10-CM | POA: Diagnosis not present

## 2020-04-09 DIAGNOSIS — Z3A39 39 weeks gestation of pregnancy: Secondary | ICD-10-CM | POA: Diagnosis not present

## 2020-04-15 DIAGNOSIS — Z23 Encounter for immunization: Secondary | ICD-10-CM | POA: Diagnosis not present

## 2020-04-15 DIAGNOSIS — O368130 Decreased fetal movements, third trimester, not applicable or unspecified: Secondary | ICD-10-CM | POA: Diagnosis not present

## 2020-04-15 DIAGNOSIS — Z3A40 40 weeks gestation of pregnancy: Secondary | ICD-10-CM | POA: Diagnosis not present

## 2020-04-15 DIAGNOSIS — O4103X0 Oligohydramnios, third trimester, not applicable or unspecified: Secondary | ICD-10-CM | POA: Diagnosis not present

## 2020-04-15 DIAGNOSIS — Z87891 Personal history of nicotine dependence: Secondary | ICD-10-CM | POA: Diagnosis not present

## 2020-04-15 DIAGNOSIS — O9952 Diseases of the respiratory system complicating childbirth: Secondary | ICD-10-CM | POA: Diagnosis not present

## 2020-04-15 DIAGNOSIS — O99824 Streptococcus B carrier state complicating childbirth: Secondary | ICD-10-CM | POA: Diagnosis not present

## 2020-04-15 DIAGNOSIS — D649 Anemia, unspecified: Secondary | ICD-10-CM | POA: Diagnosis not present

## 2020-04-15 DIAGNOSIS — J4520 Mild intermittent asthma, uncomplicated: Secondary | ICD-10-CM | POA: Diagnosis not present

## 2020-04-15 DIAGNOSIS — O9902 Anemia complicating childbirth: Secondary | ICD-10-CM | POA: Diagnosis not present

## 2020-04-16 DIAGNOSIS — O9952 Diseases of the respiratory system complicating childbirth: Secondary | ICD-10-CM | POA: Diagnosis not present

## 2020-04-16 DIAGNOSIS — O4103X0 Oligohydramnios, third trimester, not applicable or unspecified: Secondary | ICD-10-CM | POA: Diagnosis not present

## 2020-04-16 DIAGNOSIS — J4520 Mild intermittent asthma, uncomplicated: Secondary | ICD-10-CM | POA: Diagnosis not present

## 2020-04-16 DIAGNOSIS — O43893 Other placental disorders, third trimester: Secondary | ICD-10-CM | POA: Diagnosis not present

## 2020-04-16 DIAGNOSIS — Z3A40 40 weeks gestation of pregnancy: Secondary | ICD-10-CM | POA: Diagnosis not present

## 2020-04-24 DIAGNOSIS — Z23 Encounter for immunization: Secondary | ICD-10-CM | POA: Diagnosis not present

## 2021-02-20 DIAGNOSIS — Z419 Encounter for procedure for purposes other than remedying health state, unspecified: Secondary | ICD-10-CM | POA: Diagnosis not present

## 2021-03-23 DIAGNOSIS — Z419 Encounter for procedure for purposes other than remedying health state, unspecified: Secondary | ICD-10-CM | POA: Diagnosis not present

## 2021-04-23 DIAGNOSIS — Z419 Encounter for procedure for purposes other than remedying health state, unspecified: Secondary | ICD-10-CM | POA: Diagnosis not present

## 2021-05-23 DIAGNOSIS — Z419 Encounter for procedure for purposes other than remedying health state, unspecified: Secondary | ICD-10-CM | POA: Diagnosis not present

## 2021-05-27 IMAGING — US US MFM OB COMP +14 WKS
1 series · 13 of 28 positions shown · non-contrast
Comparison: none

[Series 1: us mfm ob comp +14 wks · 13 of 76 slices shown]
[im 3/76]
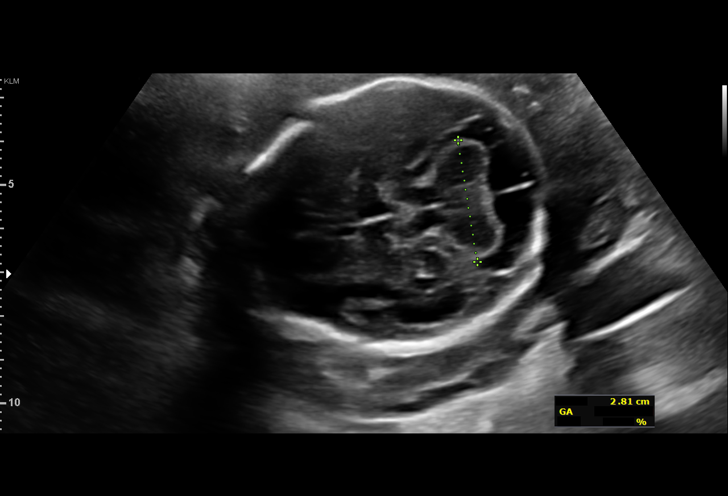
[im 9/76]
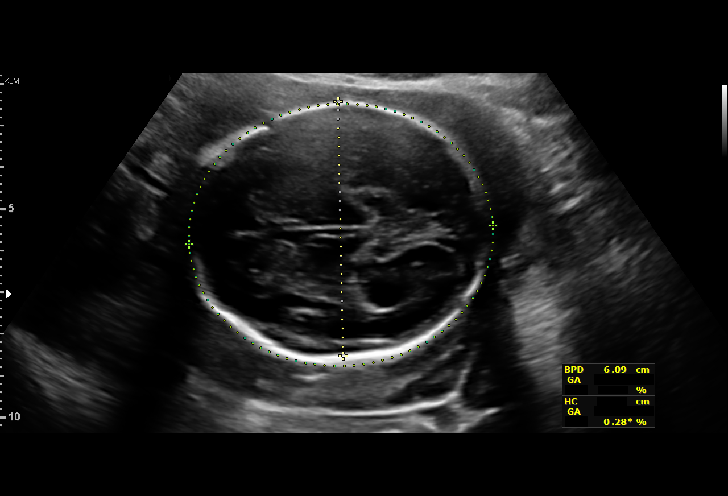
[im 14/76]
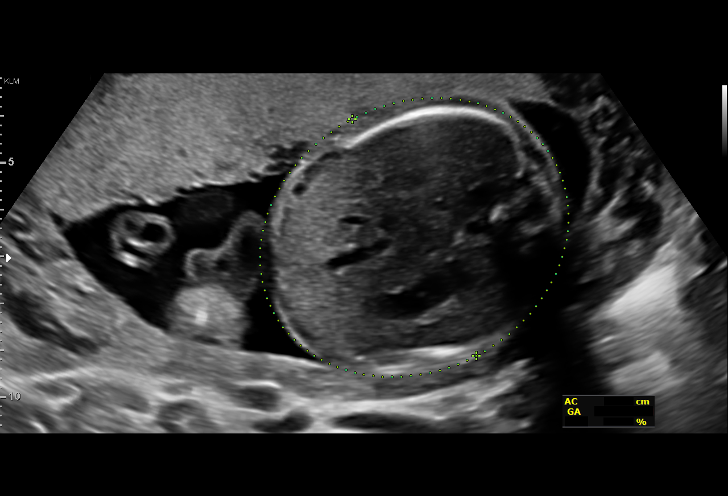
[im 20/76]
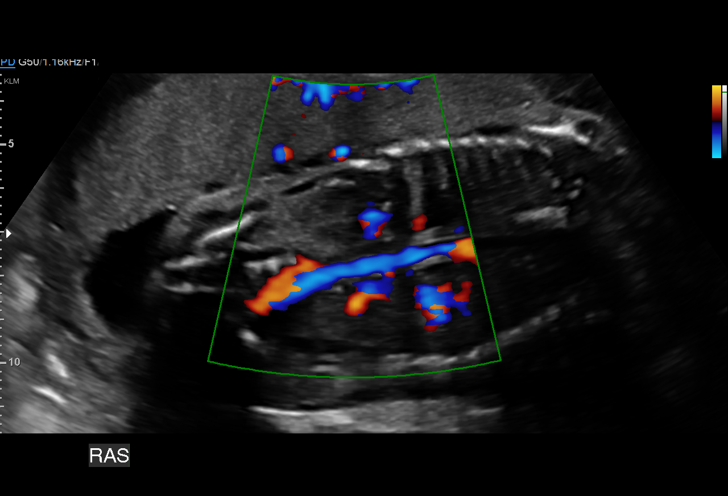
[im 26/76]
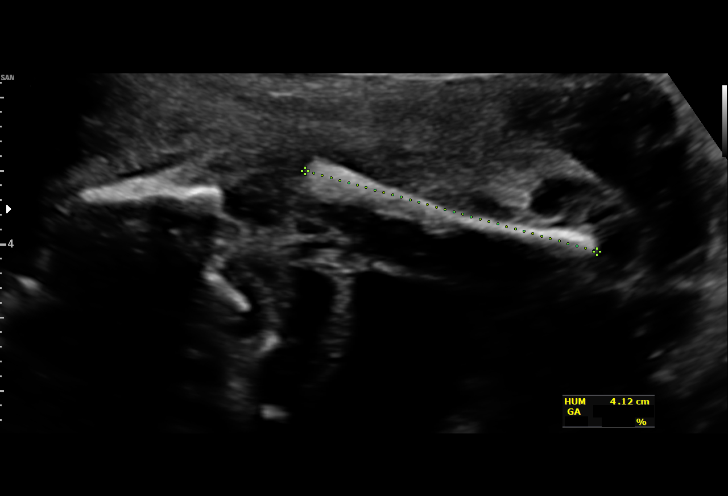
[im 31/76]
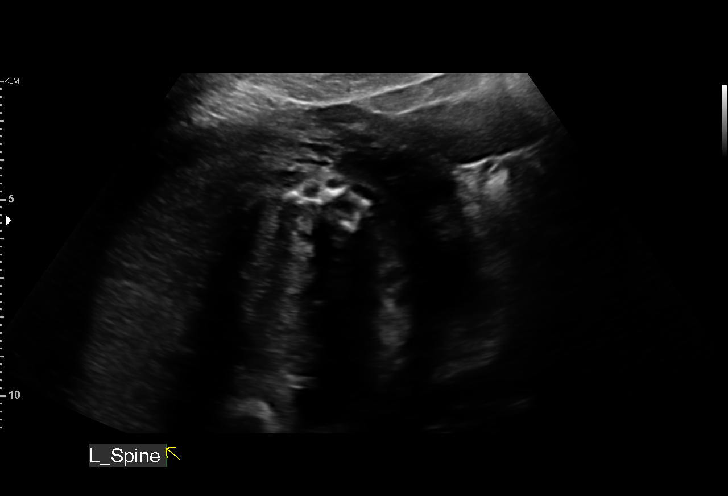
[im 39/76]
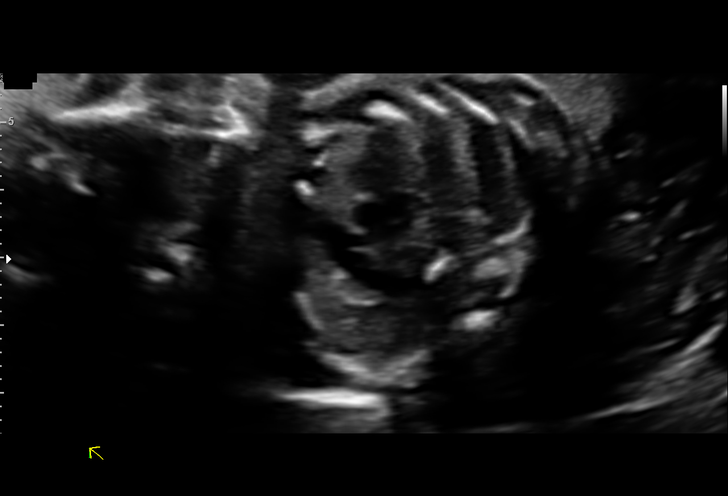
[im 45/76]
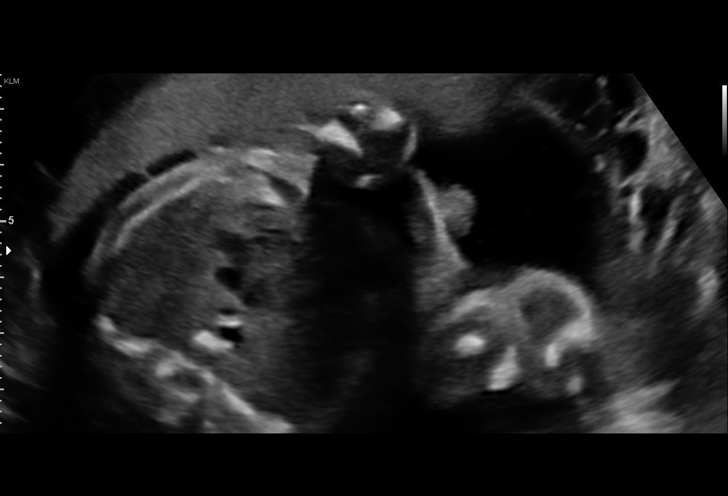
[im 51/76]
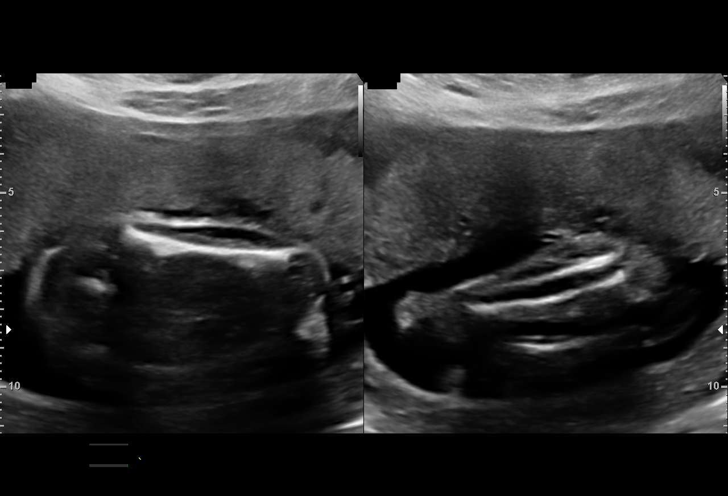
[im 56/76]
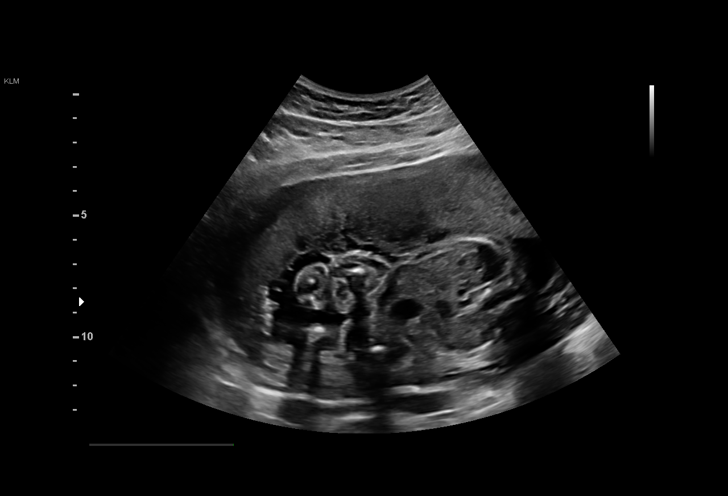
[im 62/76]
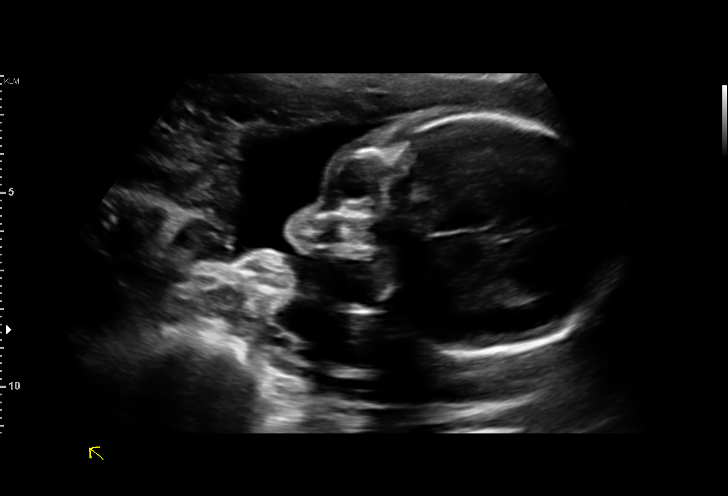
[im 67/76]
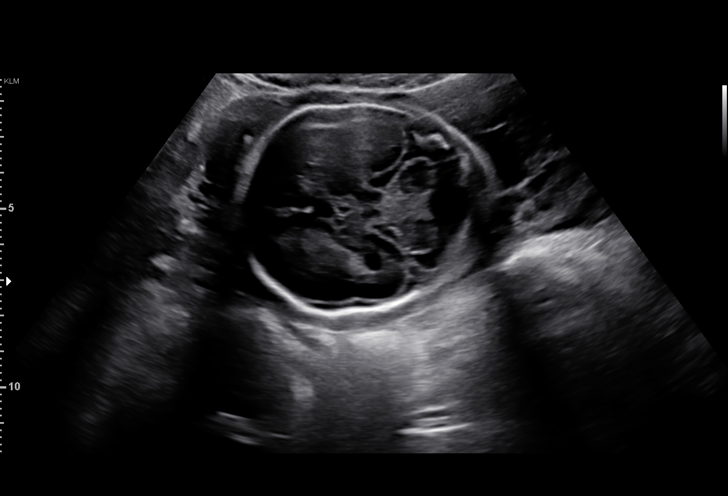
[im 73/76]
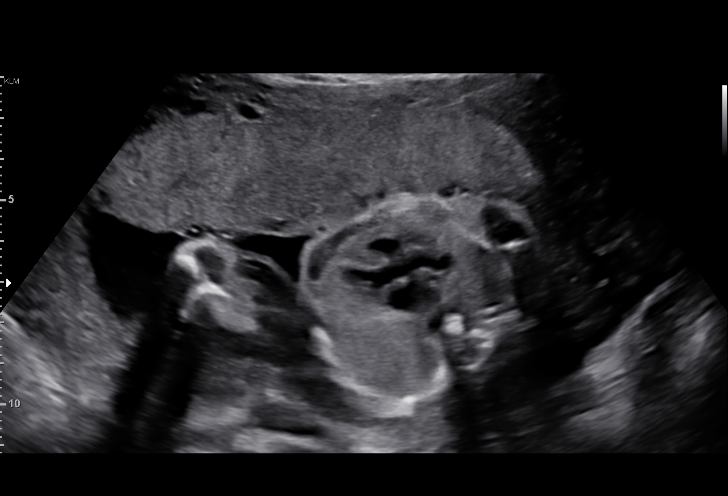

[13 of 28 positions shown; findings below may reference images not displayed]

Indications

 Encounter for antenatal screening for
 malformations (low risk NIPS)
 Encounter for uncertain dates
 24 weeks gestation of pregnancy
Fetal Evaluation

 Num Of Fetuses:         1
 Fetal Heart Rate(bpm):  151
 Cardiac Activity:       Observed
 Presentation:           Cephalic
 Placenta:               Anterior
 P. Cord Insertion:      Visualized

 Amniotic Fluid
 AFI FV:      Within normal limits
Biometry

 BPD:      60.7  mm     G. Age:  24w 5d         53  %    CI:        78.82   %    70 - 86
                                                         FL/HC:      20.4   %    18.7 -
 HC:      216.2  mm     G. Age:  23w 5d         10  %    HC/AC:      1.09        1.05 -
 AC:      198.2  mm     G. Age:  24w 3d         42  %    FL/BPD:     72.8   %    71 - 87
 FL:       44.2  mm     G. Age:  24w 4d         41  %    FL/AC:      22.3   %    20 - 24
 HUM:      40.2  mm     G. Age:  24w 3d         43  %
 CER:      28.1  mm     G. Age:  25w 1d         66  %

 Est. FW:     696  gm      1 lb 9 oz     41  %
OB History
 Gravidity:    1         Term:   0        Prem:   0        SAB:   0
 TOP:          0       Ectopic:  0        Living: 0
Gestational Age

 LMP:           24w 5d        Date:  07/08/19                 EDD:   04/13/20
 U/S Today:     24w 3d                                        EDD:   04/15/20
 Best:          24w 3d     Det. By:  U/S (12/28/19)           EDD:   04/15/20
Anatomy

 Cranium:               Appears normal         Aortic Arch:            Appears normal
 Cavum:                 Appears normal         Ductal Arch:            Appears normal
 Ventricles:            Appears normal         Diaphragm:              Appears normal
 Choroid Plexus:        Appears normal         Stomach:                Appears normal, left
                                                                       sided
 Cerebellum:            Appears normal         Abdomen:                Appears normal
 Posterior Fossa:       Appears normal         Abdominal Wall:         Not well visualized
 Nuchal Fold:           Not applicable (>20    Cord Vessels:           Appears normal (3
                        wks GA)                                        vessel cord)
 Face:                  Orbits nl; profile not Kidneys:                Appear normal
                        well visualized
 Lips:                  Not well visualized    Bladder:                Appears normal
 Thoracic:              Appears normal         Spine:                  Appears normal
 Heart:                 Appears normal         Upper Extremities:      Appears normal
                        (4CH, axis, and
                        situs)
 RVOT:                  Appears normal         Lower Extremities:      Appears normal
 LVOT:                  Appears normal

 Other:  Nasal bone visualized. Heels visualized. Nasal bone visualized. 3VV
         and 3VTV visualized. Hands not well visualized.
Cervix Uterus Adnexa

 Cervix
 Not visualized (advanced GA >63wks)
Comments

 This patient was seen for an ultrasound as her due date is
 uncertain.  The patient reports that she was seen by a
 physician in [place] where she was told that her
 EDC was April 18, 2020. However, she was also told that
 her due date could be April 07, 2020.  She denies any
 significant past medical history and denies any problems in
 her current pregnancy.

 She reports that she had a cell free DNA test earlier in her
 pregnancy which indicated a low risk for trisomy 21, 18, and
 13.

 On today's exam, the fetal biometry measurements are
 consistent with EDC April 15, 2020.  There was normal
 amniotic fluid noted.

 There were no obvious anomalies noted on today's exam.
 However, today's exam was limited due to her gestational
 age.

 A follow-up exam was scheduled in 4 weeks.  Should her
 next growth ultrasound be consistent with an EDC April 15, 2020, that date should be used as her final EDC.

## 2021-06-09 DIAGNOSIS — Z20822 Contact with and (suspected) exposure to covid-19: Secondary | ICD-10-CM | POA: Diagnosis not present

## 2021-06-09 DIAGNOSIS — J111 Influenza due to unidentified influenza virus with other respiratory manifestations: Secondary | ICD-10-CM | POA: Diagnosis not present

## 2021-06-23 DIAGNOSIS — Z419 Encounter for procedure for purposes other than remedying health state, unspecified: Secondary | ICD-10-CM | POA: Diagnosis not present

## 2021-07-23 DIAGNOSIS — Z419 Encounter for procedure for purposes other than remedying health state, unspecified: Secondary | ICD-10-CM | POA: Diagnosis not present

## 2021-08-23 DIAGNOSIS — Z419 Encounter for procedure for purposes other than remedying health state, unspecified: Secondary | ICD-10-CM | POA: Diagnosis not present

## 2021-09-23 DIAGNOSIS — Z419 Encounter for procedure for purposes other than remedying health state, unspecified: Secondary | ICD-10-CM | POA: Diagnosis not present

## 2021-10-21 DIAGNOSIS — Z419 Encounter for procedure for purposes other than remedying health state, unspecified: Secondary | ICD-10-CM | POA: Diagnosis not present

## 2021-11-21 DIAGNOSIS — Z419 Encounter for procedure for purposes other than remedying health state, unspecified: Secondary | ICD-10-CM | POA: Diagnosis not present

## 2022-04-22 DIAGNOSIS — O99331 Smoking (tobacco) complicating pregnancy, first trimester: Secondary | ICD-10-CM | POA: Diagnosis not present

## 2022-04-22 DIAGNOSIS — Z3A01 Less than 8 weeks gestation of pregnancy: Secondary | ICD-10-CM | POA: Diagnosis not present

## 2022-04-22 DIAGNOSIS — O99211 Obesity complicating pregnancy, first trimester: Secondary | ICD-10-CM | POA: Diagnosis not present

## 2022-04-22 DIAGNOSIS — O209 Hemorrhage in early pregnancy, unspecified: Secondary | ICD-10-CM | POA: Diagnosis not present

## 2022-04-22 DIAGNOSIS — F17290 Nicotine dependence, other tobacco product, uncomplicated: Secondary | ICD-10-CM | POA: Diagnosis not present

## 2022-04-28 DIAGNOSIS — Z3201 Encounter for pregnancy test, result positive: Secondary | ICD-10-CM | POA: Diagnosis not present

## 2022-05-20 DIAGNOSIS — Z3481 Encounter for supervision of other normal pregnancy, first trimester: Secondary | ICD-10-CM | POA: Diagnosis not present

## 2022-05-20 DIAGNOSIS — Z3689 Encounter for other specified antenatal screening: Secondary | ICD-10-CM | POA: Diagnosis not present

## 2022-05-27 DIAGNOSIS — Z0389 Encounter for observation for other suspected diseases and conditions ruled out: Secondary | ICD-10-CM | POA: Diagnosis not present

## 2022-05-27 DIAGNOSIS — Z3687 Encounter for antenatal screening for uncertain dates: Secondary | ICD-10-CM | POA: Diagnosis not present

## 2022-05-27 DIAGNOSIS — Z708 Other sex counseling: Secondary | ICD-10-CM | POA: Diagnosis not present

## 2022-05-27 DIAGNOSIS — Z113 Encounter for screening for infections with a predominantly sexual mode of transmission: Secondary | ICD-10-CM | POA: Diagnosis not present

## 2022-05-27 DIAGNOSIS — A539 Syphilis, unspecified: Secondary | ICD-10-CM | POA: Diagnosis not present

## 2022-06-03 DIAGNOSIS — A539 Syphilis, unspecified: Secondary | ICD-10-CM | POA: Diagnosis not present

## 2022-06-03 DIAGNOSIS — Z708 Other sex counseling: Secondary | ICD-10-CM | POA: Diagnosis not present

## 2022-06-10 DIAGNOSIS — Z708 Other sex counseling: Secondary | ICD-10-CM | POA: Diagnosis not present

## 2022-06-10 DIAGNOSIS — A539 Syphilis, unspecified: Secondary | ICD-10-CM | POA: Diagnosis not present

## 2022-07-19 DIAGNOSIS — A5602 Chlamydial vulvovaginitis: Secondary | ICD-10-CM | POA: Diagnosis not present

## 2022-08-04 DIAGNOSIS — Z363 Encounter for antenatal screening for malformations: Secondary | ICD-10-CM | POA: Diagnosis not present

## 2022-08-17 DIAGNOSIS — Z361 Encounter for antenatal screening for raised alphafetoprotein level: Secondary | ICD-10-CM | POA: Diagnosis not present

## 2022-09-15 DIAGNOSIS — Z362 Encounter for other antenatal screening follow-up: Secondary | ICD-10-CM | POA: Diagnosis not present

## 2022-09-28 DIAGNOSIS — Z3482 Encounter for supervision of other normal pregnancy, second trimester: Secondary | ICD-10-CM | POA: Diagnosis not present

## 2022-09-28 DIAGNOSIS — Z3A27 27 weeks gestation of pregnancy: Secondary | ICD-10-CM | POA: Diagnosis not present

## 2022-09-28 DIAGNOSIS — Z23 Encounter for immunization: Secondary | ICD-10-CM | POA: Diagnosis not present

## 2022-09-28 DIAGNOSIS — O0992 Supervision of high risk pregnancy, unspecified, second trimester: Secondary | ICD-10-CM | POA: Diagnosis not present

## 2022-09-28 DIAGNOSIS — Z3689 Encounter for other specified antenatal screening: Secondary | ICD-10-CM | POA: Diagnosis not present

## 2022-10-04 DIAGNOSIS — S0990XA Unspecified injury of head, initial encounter: Secondary | ICD-10-CM | POA: Diagnosis not present

## 2022-10-04 DIAGNOSIS — Z3A28 28 weeks gestation of pregnancy: Secondary | ICD-10-CM | POA: Diagnosis not present

## 2022-10-04 DIAGNOSIS — M25512 Pain in left shoulder: Secondary | ICD-10-CM | POA: Diagnosis not present

## 2022-10-04 DIAGNOSIS — M79632 Pain in left forearm: Secondary | ICD-10-CM | POA: Diagnosis not present

## 2022-10-04 DIAGNOSIS — O99213 Obesity complicating pregnancy, third trimester: Secondary | ICD-10-CM | POA: Diagnosis not present

## 2022-10-04 DIAGNOSIS — O26893 Other specified pregnancy related conditions, third trimester: Secondary | ICD-10-CM | POA: Diagnosis not present

## 2022-10-04 DIAGNOSIS — Z6830 Body mass index (BMI) 30.0-30.9, adult: Secondary | ICD-10-CM | POA: Diagnosis not present

## 2022-10-04 DIAGNOSIS — O0933 Supervision of pregnancy with insufficient antenatal care, third trimester: Secondary | ICD-10-CM | POA: Diagnosis not present

## 2022-10-04 DIAGNOSIS — Z3A29 29 weeks gestation of pregnancy: Secondary | ICD-10-CM | POA: Diagnosis not present

## 2022-10-04 DIAGNOSIS — O368130 Decreased fetal movements, third trimester, not applicable or unspecified: Secondary | ICD-10-CM | POA: Diagnosis not present

## 2022-10-04 DIAGNOSIS — R42 Dizziness and giddiness: Secondary | ICD-10-CM | POA: Diagnosis not present

## 2022-10-04 DIAGNOSIS — F17290 Nicotine dependence, other tobacco product, uncomplicated: Secondary | ICD-10-CM | POA: Diagnosis not present

## 2022-10-04 DIAGNOSIS — O4693 Antepartum hemorrhage, unspecified, third trimester: Secondary | ICD-10-CM | POA: Diagnosis not present

## 2022-10-04 DIAGNOSIS — R1032 Left lower quadrant pain: Secondary | ICD-10-CM | POA: Diagnosis not present

## 2022-10-04 DIAGNOSIS — O9A213 Injury, poisoning and certain other consequences of external causes complicating pregnancy, third trimester: Secondary | ICD-10-CM | POA: Diagnosis not present

## 2022-10-04 DIAGNOSIS — S0081XA Abrasion of other part of head, initial encounter: Secondary | ICD-10-CM | POA: Diagnosis not present

## 2022-10-04 DIAGNOSIS — O99333 Smoking (tobacco) complicating pregnancy, third trimester: Secondary | ICD-10-CM | POA: Diagnosis not present

## 2022-10-26 DIAGNOSIS — O23593 Infection of other part of genital tract in pregnancy, third trimester: Secondary | ICD-10-CM | POA: Diagnosis not present

## 2022-10-26 DIAGNOSIS — R102 Pelvic and perineal pain: Secondary | ICD-10-CM | POA: Diagnosis not present

## 2022-10-26 DIAGNOSIS — O26893 Other specified pregnancy related conditions, third trimester: Secondary | ICD-10-CM | POA: Diagnosis not present

## 2022-10-26 DIAGNOSIS — Z3A31 31 weeks gestation of pregnancy: Secondary | ICD-10-CM | POA: Diagnosis not present

## 2022-10-26 DIAGNOSIS — Z3483 Encounter for supervision of other normal pregnancy, third trimester: Secondary | ICD-10-CM | POA: Diagnosis not present

## 2022-10-26 DIAGNOSIS — O200 Threatened abortion: Secondary | ICD-10-CM | POA: Diagnosis not present

## 2022-10-26 DIAGNOSIS — B9689 Other specified bacterial agents as the cause of diseases classified elsewhere: Secondary | ICD-10-CM | POA: Diagnosis not present

## 2022-10-27 DIAGNOSIS — Z3A31 31 weeks gestation of pregnancy: Secondary | ICD-10-CM | POA: Diagnosis not present

## 2022-11-04 DIAGNOSIS — Z3493 Encounter for supervision of normal pregnancy, unspecified, third trimester: Secondary | ICD-10-CM | POA: Diagnosis not present

## 2022-11-18 DIAGNOSIS — N898 Other specified noninflammatory disorders of vagina: Secondary | ICD-10-CM | POA: Diagnosis not present

## 2022-11-25 DIAGNOSIS — Z3685 Encounter for antenatal screening for Streptococcus B: Secondary | ICD-10-CM | POA: Diagnosis not present

## 2022-12-02 DIAGNOSIS — Z0371 Encounter for suspected problem with amniotic cavity and membrane ruled out: Secondary | ICD-10-CM | POA: Diagnosis not present

## 2022-12-02 DIAGNOSIS — Z3483 Encounter for supervision of other normal pregnancy, third trimester: Secondary | ICD-10-CM | POA: Diagnosis not present

## 2022-12-12 DIAGNOSIS — Z3A38 38 weeks gestation of pregnancy: Secondary | ICD-10-CM | POA: Diagnosis not present

## 2022-12-12 DIAGNOSIS — Z3483 Encounter for supervision of other normal pregnancy, third trimester: Secondary | ICD-10-CM | POA: Diagnosis not present

## 2022-12-15 DIAGNOSIS — O99824 Streptococcus B carrier state complicating childbirth: Secondary | ICD-10-CM | POA: Diagnosis not present

## 2022-12-15 DIAGNOSIS — O99214 Obesity complicating childbirth: Secondary | ICD-10-CM | POA: Diagnosis not present

## 2022-12-15 DIAGNOSIS — Z3A38 38 weeks gestation of pregnancy: Secondary | ICD-10-CM | POA: Diagnosis not present

## 2022-12-15 DIAGNOSIS — O9903 Anemia complicating the puerperium: Secondary | ICD-10-CM | POA: Diagnosis not present

## 2022-12-15 DIAGNOSIS — D62 Acute posthemorrhagic anemia: Secondary | ICD-10-CM | POA: Diagnosis not present

## 2022-12-15 DIAGNOSIS — O9952 Diseases of the respiratory system complicating childbirth: Secondary | ICD-10-CM | POA: Diagnosis not present

## 2022-12-15 DIAGNOSIS — Z87891 Personal history of nicotine dependence: Secondary | ICD-10-CM | POA: Diagnosis not present

## 2022-12-15 DIAGNOSIS — D563 Thalassemia minor: Secondary | ICD-10-CM | POA: Diagnosis not present

## 2022-12-15 DIAGNOSIS — O99820 Streptococcus B carrier state complicating pregnancy: Secondary | ICD-10-CM | POA: Diagnosis not present

## 2022-12-15 DIAGNOSIS — J4520 Mild intermittent asthma, uncomplicated: Secondary | ICD-10-CM | POA: Diagnosis not present

## 2022-12-22 DIAGNOSIS — Z419 Encounter for procedure for purposes other than remedying health state, unspecified: Secondary | ICD-10-CM | POA: Diagnosis not present

## 2023-01-13 DIAGNOSIS — Z30013 Encounter for initial prescription of injectable contraceptive: Secondary | ICD-10-CM | POA: Diagnosis not present

## 2023-01-13 DIAGNOSIS — Z1332 Encounter for screening for maternal depression: Secondary | ICD-10-CM | POA: Diagnosis not present

## 2023-02-14 DIAGNOSIS — D649 Anemia, unspecified: Secondary | ICD-10-CM | POA: Diagnosis not present

## 2023-02-14 DIAGNOSIS — Z79899 Other long term (current) drug therapy: Secondary | ICD-10-CM | POA: Diagnosis not present

## 2023-02-14 DIAGNOSIS — Z302 Encounter for sterilization: Secondary | ICD-10-CM | POA: Diagnosis not present

## 2023-02-14 DIAGNOSIS — J4520 Mild intermittent asthma, uncomplicated: Secondary | ICD-10-CM | POA: Diagnosis not present

## 2023-02-14 DIAGNOSIS — Z6830 Body mass index (BMI) 30.0-30.9, adult: Secondary | ICD-10-CM | POA: Diagnosis not present

## 2023-02-14 DIAGNOSIS — E669 Obesity, unspecified: Secondary | ICD-10-CM | POA: Diagnosis not present

## 2023-02-14 DIAGNOSIS — Z87891 Personal history of nicotine dependence: Secondary | ICD-10-CM | POA: Diagnosis not present

## 2023-04-24 DIAGNOSIS — Z419 Encounter for procedure for purposes other than remedying health state, unspecified: Secondary | ICD-10-CM | POA: Diagnosis not present

## 2023-05-24 DIAGNOSIS — Z419 Encounter for procedure for purposes other than remedying health state, unspecified: Secondary | ICD-10-CM | POA: Diagnosis not present

## 2023-06-24 DIAGNOSIS — Z419 Encounter for procedure for purposes other than remedying health state, unspecified: Secondary | ICD-10-CM | POA: Diagnosis not present

## 2023-07-24 DIAGNOSIS — Z419 Encounter for procedure for purposes other than remedying health state, unspecified: Secondary | ICD-10-CM | POA: Diagnosis not present

## 2023-08-24 DIAGNOSIS — Z419 Encounter for procedure for purposes other than remedying health state, unspecified: Secondary | ICD-10-CM | POA: Diagnosis not present

## 2023-09-24 DIAGNOSIS — Z419 Encounter for procedure for purposes other than remedying health state, unspecified: Secondary | ICD-10-CM | POA: Diagnosis not present

## 2023-10-22 DIAGNOSIS — Z419 Encounter for procedure for purposes other than remedying health state, unspecified: Secondary | ICD-10-CM | POA: Diagnosis not present

## 2023-12-03 DIAGNOSIS — Z419 Encounter for procedure for purposes other than remedying health state, unspecified: Secondary | ICD-10-CM | POA: Diagnosis not present

## 2024-01-02 DIAGNOSIS — Z419 Encounter for procedure for purposes other than remedying health state, unspecified: Secondary | ICD-10-CM | POA: Diagnosis not present

## 2024-02-02 DIAGNOSIS — Z419 Encounter for procedure for purposes other than remedying health state, unspecified: Secondary | ICD-10-CM | POA: Diagnosis not present

## 2024-03-03 DIAGNOSIS — Z419 Encounter for procedure for purposes other than remedying health state, unspecified: Secondary | ICD-10-CM | POA: Diagnosis not present

## 2024-03-30 DIAGNOSIS — Z7689 Persons encountering health services in other specified circumstances: Secondary | ICD-10-CM | POA: Diagnosis not present

## 2024-04-03 DIAGNOSIS — Z419 Encounter for procedure for purposes other than remedying health state, unspecified: Secondary | ICD-10-CM | POA: Diagnosis not present

## 2024-04-19 DIAGNOSIS — B3731 Acute candidiasis of vulva and vagina: Secondary | ICD-10-CM | POA: Diagnosis not present

## 2024-04-19 DIAGNOSIS — Z7689 Persons encountering health services in other specified circumstances: Secondary | ICD-10-CM | POA: Diagnosis not present

## 2024-04-19 DIAGNOSIS — Z0000 Encounter for general adult medical examination without abnormal findings: Secondary | ICD-10-CM | POA: Diagnosis not present

## 2024-04-19 DIAGNOSIS — Z0001 Encounter for general adult medical examination with abnormal findings: Secondary | ICD-10-CM | POA: Diagnosis not present

## 2024-04-19 DIAGNOSIS — Z01419 Encounter for gynecological examination (general) (routine) without abnormal findings: Secondary | ICD-10-CM | POA: Diagnosis not present

## 2024-04-19 DIAGNOSIS — Z113 Encounter for screening for infections with a predominantly sexual mode of transmission: Secondary | ICD-10-CM | POA: Diagnosis not present

## 2024-05-04 DIAGNOSIS — Z419 Encounter for procedure for purposes other than remedying health state, unspecified: Secondary | ICD-10-CM | POA: Diagnosis not present

## 2024-06-03 DIAGNOSIS — Z419 Encounter for procedure for purposes other than remedying health state, unspecified: Secondary | ICD-10-CM | POA: Diagnosis not present
